# Patient Record
Sex: Female | Born: 1945 | Race: White | Hispanic: No | Smoking: Current every day smoker
Health system: Southern US, Community
[De-identification: ages and names within clinical notes are randomized; demographics above are authoritative.]

## PROBLEM LIST (undated history)

## (undated) DIAGNOSIS — Z720 Tobacco use: Secondary | ICD-10-CM

## (undated) HISTORY — DX: Tobacco use: Z72.0

---

## 1975-08-08 HISTORY — PX: TUBAL LIGATION: SHX77

## 1999-11-29 ENCOUNTER — Encounter: Admission: RE | Admit: 1999-11-29 | Discharge: 1999-11-29 | Payer: Self-pay | Admitting: Family Medicine

## 1999-11-29 ENCOUNTER — Encounter: Payer: Self-pay | Admitting: Family Medicine

## 2000-01-09 ENCOUNTER — Other Ambulatory Visit: Admission: RE | Admit: 2000-01-09 | Discharge: 2000-01-09 | Payer: Self-pay | Admitting: Obstetrics and Gynecology

## 2000-01-09 ENCOUNTER — Encounter (INDEPENDENT_AMBULATORY_CARE_PROVIDER_SITE_OTHER): Payer: Self-pay | Admitting: Specialist

## 2000-03-13 ENCOUNTER — Other Ambulatory Visit: Admission: RE | Admit: 2000-03-13 | Discharge: 2000-03-13 | Payer: Self-pay | Admitting: Obstetrics and Gynecology

## 2000-03-13 ENCOUNTER — Encounter (INDEPENDENT_AMBULATORY_CARE_PROVIDER_SITE_OTHER): Payer: Self-pay | Admitting: Specialist

## 2000-07-23 ENCOUNTER — Other Ambulatory Visit: Admission: RE | Admit: 2000-07-23 | Discharge: 2000-07-23 | Payer: Self-pay | Admitting: Obstetrics and Gynecology

## 2000-07-24 ENCOUNTER — Encounter (INDEPENDENT_AMBULATORY_CARE_PROVIDER_SITE_OTHER): Payer: Self-pay | Admitting: Specialist

## 2000-07-24 ENCOUNTER — Other Ambulatory Visit: Admission: RE | Admit: 2000-07-24 | Discharge: 2000-07-24 | Payer: Self-pay | Admitting: Obstetrics and Gynecology

## 2000-08-07 HISTORY — PX: PARTIAL HYSTERECTOMY: SHX80

## 2000-10-16 ENCOUNTER — Other Ambulatory Visit: Admission: RE | Admit: 2000-10-16 | Discharge: 2000-10-16 | Payer: Self-pay | Admitting: Obstetrics and Gynecology

## 2000-10-17 ENCOUNTER — Encounter (INDEPENDENT_AMBULATORY_CARE_PROVIDER_SITE_OTHER): Payer: Self-pay

## 2000-10-17 ENCOUNTER — Other Ambulatory Visit: Admission: RE | Admit: 2000-10-17 | Discharge: 2000-10-17 | Payer: Self-pay | Admitting: Obstetrics and Gynecology

## 2001-02-11 ENCOUNTER — Other Ambulatory Visit: Admission: RE | Admit: 2001-02-11 | Discharge: 2001-02-11 | Payer: Self-pay | Admitting: Obstetrics and Gynecology

## 2001-03-06 ENCOUNTER — Other Ambulatory Visit: Admission: RE | Admit: 2001-03-06 | Discharge: 2001-03-06 | Payer: Self-pay | Admitting: Obstetrics and Gynecology

## 2001-05-14 ENCOUNTER — Observation Stay (HOSPITAL_COMMUNITY): Admission: RE | Admit: 2001-05-14 | Discharge: 2001-05-15 | Payer: Self-pay | Admitting: Obstetrics and Gynecology

## 2001-05-14 ENCOUNTER — Encounter (INDEPENDENT_AMBULATORY_CARE_PROVIDER_SITE_OTHER): Payer: Self-pay | Admitting: Specialist

## 2002-04-08 ENCOUNTER — Encounter: Admission: RE | Admit: 2002-04-08 | Discharge: 2002-04-08 | Payer: Self-pay | Admitting: Obstetrics and Gynecology

## 2002-04-08 ENCOUNTER — Encounter: Payer: Self-pay | Admitting: Obstetrics and Gynecology

## 2002-07-10 ENCOUNTER — Other Ambulatory Visit: Admission: RE | Admit: 2002-07-10 | Discharge: 2002-07-10 | Payer: Self-pay | Admitting: Obstetrics and Gynecology

## 2008-08-27 ENCOUNTER — Encounter
Admission: RE | Admit: 2008-08-27 | Discharge: 2008-08-27 | Payer: Self-pay | Admitting: Physical Medicine & Rehabilitation

## 2010-08-07 HISTORY — PX: BREAST BIOPSY: SHX20

## 2010-12-23 NOTE — Op Note (Signed)
[facility]  Patient:    Melinda Torres, Melinda Torres Visit Number: [id number] MRN: [id number]          Service Type: DSU Location: [id number] Attending Physician:  Jenean Lindau Dictated by:   Laqueta Linden, M.D. Proc. Date: 05/14/01 Admit Date:  05/14/2001   CC:         Derwood Kaplan., [facility]   Operative Report  PREOPERATIVE DIAGNOSIS:       Recurrent high grade SIL.  POSTOPERATIVE DIAGNOSIS:      Recurrent high grade SIL.  OPERATION:                    Transvaginal hysterectomy.  SURGEON:                      Laqueta Linden, M.D.  ASSISTANT:                    Cheryle Horsfall, M.D.  ANESTHESIA:                   General endotracheal anesthesia.  ESTIMATED BLOOD LOSS:         Less than 50 cc.  INTRAVENOUS FLUIDS:           1 liter Crystalloid.  URINE OUTPUT:                 30 cc in and out catheterization at end of                               procedure.  COUNTS:                       Correct x 2.  COMPLICATIONS:                None.  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 Follow-up Pap smears initially were within normal limits with negative endocervical curettage. She had an abnormal Pap smear in July of 2002 which was suggestive of CIN I, however, follow-up colposcopy and biopsies revealed CIN II and III with a positive endocervical curettage with CIN I. The patient was counseled regarding the management for recurrent high grade dysplasia including the alternative of a repeat LEEP with follow-up colposcopies versus vaginal hysterectomy. She elected to proceed to definitive surgical management due  to the recurrence of this high grade process. She has seen the informed consent film, she has been extensively counseled as to the risks, benefits  and alternatives and complications of the procedure including but not limited to anesthesia risks which are increased due to her smoking, infection, bleeding possibly requiring a transfusion, injury to bowel, bladder, ureters, vessels or nerves, fistula formation including vesico or recto, or enterovaginal fistulas and their intendent consequences, as well as other risks including DVT, PE, pneumonia, death, and other unnamed risks. She has been extensively counseled and signed the appropriate consent film, had all of her questions answered and agrees to proceed. She desires ovarian removal if it is technically possible at the time of surgery.  DESCRIPTION OF PROCEDURE:     The patient was taken to the  operating room and after proper identification and consents were ascertained, she was placed on the operating room table in the supine position. After the induction of general endotracheal anesthesia, she was placed in the candy cane stirrups and the perineum and vagina were prepped and draped in routine sterile fashion. The bladder was emptied with a red rubber catheter. The weighted speculum was placed in the posterior vagina and the cervix grasped with a single tooth tenaculum and was noted to advance well into the operative field. The portio was then injected with a 1:100,000 solution of epinephrine. The cervix was then circumscribed with a scalpel. The mucosa was then advanced bluntly off of the cervix using a finger and a Ray-Tec sponge. The posterior vaginal mucosa was then tented down and the posterior cul-de-sac entered sharply using curved Mayo scissors. The uterosacral ligaments were clamped with curved Heaney clamps bilaterally, cut and Heaney ligated with #0 Vicryl and tagged. The anterior peritoneal reflection was then identified and entered sharply without obvious injury or entry into the bladder. BMX clamp was placed across the cardinal ligament and uterine vessel complex due  to the very small size of the uterus. This pedicle was cut and suture ligated with #0 Vicryl suture. The fundus was located posteriorly and a curved clamp placed across the proximal adnexal pedicles bilaterally with removal of the specimen. This was sent immediately to pathology as a fresh specimen to have the cervix cut as a cone. At this point both ovaries were visualized and noted to be quite far up the pelvic sidewall, neither of which were mobile enough to remove transvaginally. For this reason the ovaries and tubes were left in place after prior discussion with the patient. The adnexal pedicles were triply ligated with two free ties and a stitch of #0 Vicryl. The parietoperitoneum was then closed in a pursestring fashion using 2-0 Vicryl suture with release of the adnexal pedicles in the peritoneal cavity. Hemostasis was noted to be excellent. Counts were correct prior to closure of the peritoneum. The uterosacral tags were then tied in the midline. The vaginal cuff was closed from side-to-side using interrupted figure-of-eight sutures of #0 Vicryl. Hemostasis was noted to be excellent. The bladder was emptied of 30 cc of clear urine which had accumulated during the approximate 30 minute procedure. All instruments were removed. A PeriPad was applied and the patient was awakened and transferred to the recovery room in stable condition. She will be admitted for overnight observation as an outpatient. Estimated blood loss was less than 50 cc. Urine output was 30 cc. Counts correct x 2. There were no complications. Dictated by:   Laqueta Linden, M.D. Attending Physician:  Jenean Lindau DD:  05/14/01 TD:  05/14/01 Job: [id number]

## 2011-04-20 ENCOUNTER — Institutional Professional Consult (permissible substitution): Payer: Self-pay | Admitting: Internal Medicine

## 2011-05-03 ENCOUNTER — Other Ambulatory Visit: Payer: Self-pay | Admitting: Family Medicine

## 2011-05-03 DIAGNOSIS — Z1231 Encounter for screening mammogram for malignant neoplasm of breast: Secondary | ICD-10-CM

## 2011-05-12 ENCOUNTER — Encounter: Payer: Self-pay | Admitting: Internal Medicine

## 2011-05-12 ENCOUNTER — Ambulatory Visit (INDEPENDENT_AMBULATORY_CARE_PROVIDER_SITE_OTHER)
Admission: RE | Admit: 2011-05-12 | Discharge: 2011-05-12 | Disposition: A | Discharge status: Home / Self Care | Payer: Medicare Other | Source: Ambulatory Visit | Attending: Internal Medicine | Admitting: Internal Medicine

## 2011-05-12 ENCOUNTER — Ambulatory Visit: Payer: Self-pay

## 2011-05-12 ENCOUNTER — Ambulatory Visit (INDEPENDENT_AMBULATORY_CARE_PROVIDER_SITE_OTHER): Payer: Medicare Other | Admitting: Internal Medicine

## 2011-05-12 VITALS — BP 130/72 | HR 61 | Temp 98.9°F | Ht 64.0 in | Wt 160.4 lb

## 2011-05-12 DIAGNOSIS — F17200 Nicotine dependence, unspecified, uncomplicated: Secondary | ICD-10-CM

## 2011-05-12 DIAGNOSIS — J449 Chronic obstructive pulmonary disease, unspecified: Secondary | ICD-10-CM | POA: Insufficient documentation

## 2011-05-12 NOTE — Progress Notes (Signed)
  Subjective:    Patient ID: Melinda Torres, female    DOB: November 08, 1945, 65 y.o.   MRN: [id number]  HPI  36 yowf longterm smoker with no signifcant problems until around 2011 with [place]alking [place] a [place]  and referred by Dr Ludwig Clarks for eval 05/12/2011   05/12/2011 Initial pulmonary office eval in EMR era. Cc doe x mailbox only but relatively sedentary. Also with flight of steps but no need to stop.also, no assoc cp. cc cough esp in am's worse in cold weather x sev years> sev tbps mucoid sputum daily.  Sleeping ok without nocturnal  or early am exacerbation  of respiratory  c/o's or need for noct saba. Also denies any obvious fluctuation of symptoms with weather or environmental changes or other aggravating or alleviating factors except as outlined above.  Does not recall ever using saba.      Review of Systems  Constitutional: Negative for fever, chills and unexpected weight change.  HENT: Negative for ear pain, nosebleeds, congestion, sore throat, rhinorrhea, sneezing, trouble swallowing, dental problem, voice change, postnasal drip and sinus pressure.   Eyes: Negative for visual disturbance.  Respiratory: Positive for cough and shortness of breath. Negative for choking.   Cardiovascular: Negative for chest pain and leg swelling.  Gastrointestinal: Negative for vomiting, abdominal pain and diarrhea.  Genitourinary: Negative for difficulty urinating.  Musculoskeletal: Negative for arthralgias.  Skin: Negative for rash.  Neurological: Negative for tremors, syncope and headaches.  Hematological: Does not bruise/bleed easily.       Objective:   Physical Exam   amb wf nad  Wt 160 05/12/2011   Full set of dentures  HEENT mild turbinate edema.  Oropharynx no thrush or excess pnd or cobblestoning.  No JVD or cervical adenopathy. Mild accessory muscle hypertrophy. Trachea midline, nl thryroid. Chest was hyperinflated by  percussion with diminished breath sounds and moderate increased exp time without wheeze. Hoover sign positive at mid inspiration. Regular rate and rhythm without murmur gallop or rub or increase P2 or edema.  Abd: no hsm, nl excursion. Ext warm without cyanosis or clubbing.      CXR  05/12/2011 :  Mild COPD changes with no worrisome focal or acute cardiopulmonary abnormality noted.  Upper lumbar vertebral compression fracture identified of questionable age. This could be better assessed with lumbosacral spine series if desired.    Assessment & Plan:

## 2011-05-12 NOTE — Patient Instructions (Signed)
We will call you with your cxr results  Please schedule a follow up office visit in 6 weeks, call sooner if needed with pft's - no need for inhalers in meantime

## 2011-05-13 ENCOUNTER — Encounter: Payer: Self-pay | Admitting: Internal Medicine

## 2011-05-13 DIAGNOSIS — F17200 Nicotine dependence, unspecified, uncomplicated: Secondary | ICD-10-CM | POA: Insufficient documentation

## 2011-05-13 NOTE — Assessment & Plan Note (Signed)
Clinically moderate assoc with CB but minimal ab  DDX of  difficult airways managment all start with A and  include Adherence, Ace Inhibitors, Acid Reflux, Active Sinus Disease, Alpha 1 Antitripsin deficiency, Anxiety masquerading as Airways dz,  ABPA,  allergy(esp in young), Aspiration (esp in elderly), Adverse effects of DPI,  Active smokers, plus two Bs  = Bronchiectasis and Beta blocker use..and one C= CHF  Active smoking greatest concern, discussed separtately  Needs pft's to quantify

## 2011-05-13 NOTE — Assessment & Plan Note (Signed)
I reviewed the Flethcher curve with patient that basically indicates  if you quit smoking when your best day FEV1 is still well preserved it is highly unlikely you will progress to severe disease and informed the patient there was no medication on the market that has proven to change the curve or the likelihood of progression.  Therefore stopping smoking and maintaining abstinence is the most important aspect of care, not choice of inhalers or for that matter, doctors.    I emphasized that although we never turn away smokers from the pulmonary clinic, we do ask that they understand that the recommendations that we make  won't work nearly as well in the presence of continued cigarette exposure.  In fact, we may very well  reach a point where we can't promise to help the patient if he/she can't quit smoking. (We can and will promise to try to help, we just can't promise what we recommend will really work)

## 2011-05-15 NOTE — Progress Notes (Signed)
Quick Note:  Spoke with pt and notified of results per Dr. Wert. Pt verbalized understanding and denied any questions.  ______ 

## 2011-05-16 ENCOUNTER — Ambulatory Visit
Admission: RE | Admit: 2011-05-16 | Discharge: 2011-05-16 | Disposition: A | Discharge status: Home / Self Care | Payer: Medicare Other | Source: Ambulatory Visit | Attending: Family Medicine | Admitting: Family Medicine

## 2011-05-16 DIAGNOSIS — Z1231 Encounter for screening mammogram for malignant neoplasm of breast: Secondary | ICD-10-CM

## 2011-05-25 ENCOUNTER — Other Ambulatory Visit: Payer: Self-pay | Admitting: Family Medicine

## 2011-05-25 DIAGNOSIS — R928 Other abnormal and inconclusive findings on diagnostic imaging of breast: Secondary | ICD-10-CM

## 2011-06-07 ENCOUNTER — Ambulatory Visit
Admission: RE | Admit: 2011-06-07 | Discharge: 2011-06-07 | Disposition: A | Discharge status: Home / Self Care | Payer: Medicare Other | Source: Ambulatory Visit | Attending: Family Medicine | Admitting: Family Medicine

## 2011-06-07 ENCOUNTER — Other Ambulatory Visit: Payer: Self-pay | Admitting: Family Medicine

## 2011-06-07 DIAGNOSIS — R928 Other abnormal and inconclusive findings on diagnostic imaging of breast: Secondary | ICD-10-CM

## 2011-06-23 ENCOUNTER — Ambulatory Visit: Payer: Medicare Other | Admitting: Internal Medicine

## 2011-07-25 ENCOUNTER — Ambulatory Visit: Payer: Medicare Other | Admitting: Internal Medicine

## 2012-05-01 ENCOUNTER — Other Ambulatory Visit: Payer: Self-pay | Admitting: Family Medicine

## 2012-05-01 DIAGNOSIS — R921 Mammographic calcification found on diagnostic imaging of breast: Secondary | ICD-10-CM

## 2012-05-17 ENCOUNTER — Ambulatory Visit
Admission: RE | Admit: 2012-05-17 | Discharge: 2012-05-17 | Disposition: A | Discharge status: Home / Self Care | Payer: Medicare Other | Source: Ambulatory Visit | Attending: Family Medicine | Admitting: Family Medicine

## 2012-05-17 DIAGNOSIS — R921 Mammographic calcification found on diagnostic imaging of breast: Secondary | ICD-10-CM

## 2013-05-01 ENCOUNTER — Other Ambulatory Visit: Payer: Self-pay

## 2013-05-01 DIAGNOSIS — Z1231 Encounter for screening mammogram for malignant neoplasm of breast: Secondary | ICD-10-CM

## 2013-05-23 ENCOUNTER — Ambulatory Visit
Admission: RE | Admit: 2013-05-23 | Discharge: 2013-05-23 | Disposition: A | Discharge status: Home / Self Care | Payer: Medicare Other | Source: Ambulatory Visit

## 2013-05-23 DIAGNOSIS — Z1231 Encounter for screening mammogram for malignant neoplasm of breast: Secondary | ICD-10-CM

## 2014-05-12 ENCOUNTER — Other Ambulatory Visit: Payer: Self-pay

## 2014-05-12 DIAGNOSIS — Z1231 Encounter for screening mammogram for malignant neoplasm of breast: Secondary | ICD-10-CM

## 2014-05-29 ENCOUNTER — Ambulatory Visit
Admission: RE | Admit: 2014-05-29 | Discharge: 2014-05-29 | Disposition: A | Discharge status: Home / Self Care | Payer: Medicare Other | Source: Ambulatory Visit

## 2014-05-29 DIAGNOSIS — Z1231 Encounter for screening mammogram for malignant neoplasm of breast: Secondary | ICD-10-CM

## 2015-07-26 ENCOUNTER — Other Ambulatory Visit: Payer: Self-pay

## 2015-07-26 DIAGNOSIS — Z1231 Encounter for screening mammogram for malignant neoplasm of breast: Secondary | ICD-10-CM

## 2015-08-11 ENCOUNTER — Ambulatory Visit
Admission: RE | Admit: 2015-08-11 | Discharge: 2015-08-11 | Disposition: A | Discharge status: Home / Self Care | Payer: Medicare Other | Source: Ambulatory Visit

## 2015-08-11 DIAGNOSIS — Z1231 Encounter for screening mammogram for malignant neoplasm of breast: Secondary | ICD-10-CM

## 2017-07-04 ENCOUNTER — Other Ambulatory Visit: Payer: Self-pay | Admitting: Physician Assistant

## 2017-07-04 DIAGNOSIS — Z139 Encounter for screening, unspecified: Secondary | ICD-10-CM

## 2017-08-01 ENCOUNTER — Ambulatory Visit: Payer: Medicare Other

## 2017-08-24 ENCOUNTER — Ambulatory Visit: Payer: Medicare Other

## 2018-07-19 ENCOUNTER — Other Ambulatory Visit: Payer: Self-pay | Admitting: Family Medicine

## 2018-07-19 DIAGNOSIS — Z1231 Encounter for screening mammogram for malignant neoplasm of breast: Secondary | ICD-10-CM

## 2018-08-01 ENCOUNTER — Other Ambulatory Visit: Payer: Self-pay | Admitting: Family Medicine

## 2018-08-01 ENCOUNTER — Ambulatory Visit
Admission: RE | Admit: 2018-08-01 | Discharge: 2018-08-01 | Disposition: A | Discharge status: Home / Self Care | Payer: Medicare Other | Source: Ambulatory Visit | Attending: Family Medicine | Admitting: Family Medicine

## 2018-08-01 DIAGNOSIS — M810 Age-related osteoporosis without current pathological fracture: Secondary | ICD-10-CM

## 2018-08-01 DIAGNOSIS — Z1231 Encounter for screening mammogram for malignant neoplasm of breast: Secondary | ICD-10-CM

## 2018-08-01 DIAGNOSIS — Z720 Tobacco use: Secondary | ICD-10-CM

## 2018-09-23 ENCOUNTER — Other Ambulatory Visit: Payer: Self-pay | Admitting: Family Medicine

## 2018-09-23 DIAGNOSIS — E2839 Other primary ovarian failure: Secondary | ICD-10-CM

## 2018-09-24 ENCOUNTER — Ambulatory Visit
Admission: RE | Admit: 2018-09-24 | Discharge: 2018-09-24 | Disposition: A | Discharge status: Home / Self Care | Payer: Medicare Other | Source: Ambulatory Visit | Attending: Family Medicine | Admitting: Family Medicine

## 2018-09-24 ENCOUNTER — Other Ambulatory Visit: Payer: Medicare Other

## 2018-09-24 DIAGNOSIS — Z720 Tobacco use: Secondary | ICD-10-CM

## 2018-10-01 ENCOUNTER — Other Ambulatory Visit: Payer: Self-pay | Admitting: Family Medicine

## 2018-10-01 DIAGNOSIS — K7460 Unspecified cirrhosis of liver: Secondary | ICD-10-CM

## 2018-10-07 ENCOUNTER — Ambulatory Visit
Admission: RE | Admit: 2018-10-07 | Discharge: 2018-10-07 | Disposition: A | Discharge status: Home / Self Care | Payer: Medicare Other | Source: Ambulatory Visit | Attending: Family Medicine | Admitting: Family Medicine

## 2018-10-07 DIAGNOSIS — K7460 Unspecified cirrhosis of liver: Secondary | ICD-10-CM

## 2018-11-26 ENCOUNTER — Other Ambulatory Visit: Payer: Medicare Other

## 2018-12-27 ENCOUNTER — Other Ambulatory Visit: Payer: Medicare Other

## 2019-02-18 ENCOUNTER — Ambulatory Visit
Admission: RE | Admit: 2019-02-18 | Discharge: 2019-02-18 | Disposition: A | Discharge status: Home / Self Care | Payer: Medicare Other | Source: Ambulatory Visit | Attending: Family Medicine | Admitting: Family Medicine

## 2019-02-18 ENCOUNTER — Other Ambulatory Visit: Payer: Self-pay

## 2019-02-18 DIAGNOSIS — E2839 Other primary ovarian failure: Secondary | ICD-10-CM

## 2019-06-04 ENCOUNTER — Other Ambulatory Visit: Payer: Self-pay | Admitting: Family Medicine

## 2019-06-04 DIAGNOSIS — Z1231 Encounter for screening mammogram for malignant neoplasm of breast: Secondary | ICD-10-CM

## 2019-08-04 ENCOUNTER — Other Ambulatory Visit: Payer: Self-pay

## 2019-08-04 ENCOUNTER — Ambulatory Visit
Admission: RE | Admit: 2019-08-04 | Discharge: 2019-08-04 | Disposition: A | Discharge status: Home / Self Care | Payer: Medicare Other | Source: Ambulatory Visit | Attending: Family Medicine | Admitting: Family Medicine

## 2019-08-04 DIAGNOSIS — Z1231 Encounter for screening mammogram for malignant neoplasm of breast: Secondary | ICD-10-CM

## 2019-08-19 ENCOUNTER — Other Ambulatory Visit: Payer: Self-pay | Admitting: Family Medicine

## 2019-08-19 ENCOUNTER — Ambulatory Visit
Admission: RE | Admit: 2019-08-19 | Discharge: 2019-08-19 | Disposition: A | Discharge status: Home / Self Care | Payer: Medicare Other | Source: Ambulatory Visit | Attending: Family Medicine | Admitting: Family Medicine

## 2019-08-19 ENCOUNTER — Other Ambulatory Visit: Payer: Self-pay

## 2019-08-19 DIAGNOSIS — M810 Age-related osteoporosis without current pathological fracture: Secondary | ICD-10-CM

## 2019-08-19 DIAGNOSIS — W19XXXA Unspecified fall, initial encounter: Secondary | ICD-10-CM

## 2020-05-12 ENCOUNTER — Other Ambulatory Visit: Payer: Self-pay | Admitting: Family Medicine

## 2020-05-12 DIAGNOSIS — Z1231 Encounter for screening mammogram for malignant neoplasm of breast: Secondary | ICD-10-CM

## 2020-08-04 ENCOUNTER — Ambulatory Visit
Admission: RE | Admit: 2020-08-04 | Discharge: 2020-08-04 | Disposition: A | Discharge status: Home / Self Care | Payer: Medicare Other | Source: Ambulatory Visit | Attending: Family Medicine | Admitting: Family Medicine

## 2020-08-04 ENCOUNTER — Other Ambulatory Visit: Payer: Self-pay

## 2020-08-04 DIAGNOSIS — Z1231 Encounter for screening mammogram for malignant neoplasm of breast: Secondary | ICD-10-CM

## 2020-10-28 ENCOUNTER — Other Ambulatory Visit (HOSPITAL_BASED_OUTPATIENT_CLINIC_OR_DEPARTMENT_OTHER): Payer: Self-pay | Admitting: Family Medicine

## 2020-10-28 DIAGNOSIS — F17200 Nicotine dependence, unspecified, uncomplicated: Secondary | ICD-10-CM

## 2020-11-15 ENCOUNTER — Other Ambulatory Visit: Payer: Self-pay

## 2020-11-15 ENCOUNTER — Ambulatory Visit (HOSPITAL_BASED_OUTPATIENT_CLINIC_OR_DEPARTMENT_OTHER)
Admission: RE | Admit: 2020-11-15 | Discharge: 2020-11-15 | Disposition: A | Discharge status: Home / Self Care | Payer: Medicare Other | Source: Ambulatory Visit | Attending: Family Medicine | Admitting: Family Medicine

## 2020-11-15 DIAGNOSIS — F17210 Nicotine dependence, cigarettes, uncomplicated: Secondary | ICD-10-CM | POA: Insufficient documentation

## 2020-11-15 DIAGNOSIS — Z122 Encounter for screening for malignant neoplasm of respiratory organs: Secondary | ICD-10-CM | POA: Diagnosis not present

## 2020-11-15 DIAGNOSIS — F17200 Nicotine dependence, unspecified, uncomplicated: Secondary | ICD-10-CM

## 2020-11-15 DIAGNOSIS — J432 Centrilobular emphysema: Secondary | ICD-10-CM | POA: Diagnosis not present

## 2020-11-15 DIAGNOSIS — I700 Atherosclerosis of aorta: Secondary | ICD-10-CM | POA: Insufficient documentation

## 2020-11-15 DIAGNOSIS — I2510 Atherosclerotic heart disease of native coronary artery without angina pectoris: Secondary | ICD-10-CM | POA: Diagnosis not present

## 2021-08-26 ENCOUNTER — Other Ambulatory Visit: Payer: Self-pay | Admitting: Family Medicine

## 2021-08-26 DIAGNOSIS — Z1231 Encounter for screening mammogram for malignant neoplasm of breast: Secondary | ICD-10-CM

## 2021-09-21 ENCOUNTER — Ambulatory Visit
Admission: RE | Admit: 2021-09-21 | Discharge: 2021-09-21 | Disposition: A | Discharge status: Home / Self Care | Payer: Medicare Other | Source: Ambulatory Visit | Attending: Family Medicine | Admitting: Family Medicine

## 2021-09-21 DIAGNOSIS — Z1231 Encounter for screening mammogram for malignant neoplasm of breast: Secondary | ICD-10-CM

## 2021-11-02 ENCOUNTER — Other Ambulatory Visit (HOSPITAL_BASED_OUTPATIENT_CLINIC_OR_DEPARTMENT_OTHER): Payer: Self-pay | Admitting: Family Medicine

## 2021-11-02 DIAGNOSIS — M8000XD Age-related osteoporosis with current pathological fracture, unspecified site, subsequent encounter for fracture with routine healing: Secondary | ICD-10-CM

## 2021-11-02 DIAGNOSIS — Z87891 Personal history of nicotine dependence: Secondary | ICD-10-CM

## 2021-11-15 ENCOUNTER — Ambulatory Visit (HOSPITAL_BASED_OUTPATIENT_CLINIC_OR_DEPARTMENT_OTHER)
Admission: RE | Admit: 2021-11-15 | Discharge: 2021-11-15 | Disposition: A | Discharge status: Home / Self Care | Payer: Medicare Other | Source: Ambulatory Visit | Attending: Family Medicine | Admitting: Family Medicine

## 2021-11-15 DIAGNOSIS — M8000XD Age-related osteoporosis with current pathological fracture, unspecified site, subsequent encounter for fracture with routine healing: Secondary | ICD-10-CM

## 2021-11-15 DIAGNOSIS — Z8262 Family history of osteoporosis: Secondary | ICD-10-CM | POA: Diagnosis not present

## 2021-11-15 DIAGNOSIS — Z87891 Personal history of nicotine dependence: Secondary | ICD-10-CM | POA: Insufficient documentation

## 2021-11-15 DIAGNOSIS — J439 Emphysema, unspecified: Secondary | ICD-10-CM | POA: Diagnosis not present

## 2021-11-15 DIAGNOSIS — Z13820 Encounter for screening for osteoporosis: Secondary | ICD-10-CM | POA: Insufficient documentation

## 2021-11-15 DIAGNOSIS — Z780 Asymptomatic menopausal state: Secondary | ICD-10-CM | POA: Insufficient documentation

## 2021-11-15 DIAGNOSIS — Z122 Encounter for screening for malignant neoplasm of respiratory organs: Secondary | ICD-10-CM | POA: Insufficient documentation

## 2021-11-15 DIAGNOSIS — I700 Atherosclerosis of aorta: Secondary | ICD-10-CM | POA: Diagnosis not present

## 2021-11-18 ENCOUNTER — Encounter: Payer: Self-pay | Admitting: *Deleted

## 2021-11-18 DIAGNOSIS — R911 Solitary pulmonary nodule: Secondary | ICD-10-CM

## 2021-11-18 NOTE — Progress Notes (Signed)
I received referral on Melinda Torres today.  Looking at the notes, patient needs to be seen with pulmonary.  Dr. Arbutus Ped aware and referral to pulmonary completed.   ?

## 2021-12-14 ENCOUNTER — Encounter: Payer: Self-pay | Admitting: Pulmonary Disease

## 2021-12-14 ENCOUNTER — Ambulatory Visit: Payer: Medicare Other | Admitting: Pulmonary Disease

## 2021-12-14 VITALS — BP 118/62 | HR 51 | Temp 98.6°F | Ht 64.0 in | Wt 157.6 lb

## 2021-12-14 DIAGNOSIS — Z720 Tobacco use: Secondary | ICD-10-CM

## 2021-12-14 DIAGNOSIS — J432 Centrilobular emphysema: Secondary | ICD-10-CM

## 2021-12-14 DIAGNOSIS — R911 Solitary pulmonary nodule: Secondary | ICD-10-CM | POA: Diagnosis not present

## 2021-12-14 DIAGNOSIS — F17200 Nicotine dependence, unspecified, uncomplicated: Secondary | ICD-10-CM | POA: Diagnosis not present

## 2021-12-14 DIAGNOSIS — Z716 Tobacco abuse counseling: Secondary | ICD-10-CM

## 2021-12-14 MED ORDER — AMOXICILLIN-POT CLAVULANATE 875-125 MG PO TABS: 1.0000 | ORAL_TABLET | Freq: Two times a day (BID) | ORAL | 0 refills | Status: AC

## 2021-12-14 NOTE — Progress Notes (Signed)
? ?Synopsis: Referred in May 2023 for the lung nodule, abnormal lung cancer screening CT by Si Gaul, MD ? ?Subjective:  ? ?PATIENT ID: Melinda Torres Height GENDER: female DOB: 06/03/1946, MRN: [id number] ? ?Chief Complaint  ?Patient presents with  ? Consult  ?  Patient here to talk about nodule in right lung.   ? ? ?This is a 76 year old female, past medical history of tobacco use.  She has smoked since she was a teenager 40+ years.  She had a lung cancer screening CT completed by primary care which showed a new right upper lobe pulmonary nodule this was located in the periphery of the lung there was some likely evidence of inflammation around this.  Felt to be more inflammatory than malignant.  Recommended short-term follow-up.  She also woke up this morning with earache and pain in her right ear.  She did not receive any antibiotics during this timeframe.  She did felt bad and felt sick during the time that she got her lung cancer screening CT. ? ? ?Past Medical History:  ?Diagnosis Date  ? Tobacco use   ?  ? ?Family History  ?Problem Relation Age of Onset  ? Allergies Daughter   ?  ? ?Past Surgical History:  ?Procedure Laterality Date  ? PARTIAL HYSTERECTOMY  2002  ? TUBAL LIGATION  1977  ? ? ?Social History  ? ?Socioeconomic History  ? Marital status: Divorced  ?  Spouse name: Not on file  ? Number of children: 3  ? Years of education: Not on file  ? Highest education level: Not on file  ?Occupational History  ? Occupation: Retired  ?  Comment: Data Processor  ?Tobacco Use  ? Smoking status: Every Day  ?  Packs/day: 2.00  ?  Years: 40.00  ?  Pack years: 80.00  ?  Types: Cigarettes  ? Smokeless tobacco: Never  ?Substance and Sexual Activity  ? Alcohol use: No  ? Drug use: No  ? Sexual activity: Not on file  ?Other Topics Concern  ? Not on file  ?Social History Narrative  ? Not on file  ? ?Social Determinants of Health  ? ?Financial Resource Strain: Not on file  ?Food Insecurity: Not on file   ?Transportation Needs: Not on file  ?Physical Activity: Not on file  ?Stress: Not on file  ?Social Connections: Not on file  ?Intimate Partner Violence: Not on file  ?  ? ?No Known Allergies  ? ?Outpatient Medications Prior to Visit  ?Medication Sig Dispense Refill  ? SPIRIVA RESPIMAT 2.5 MCG/ACT AERS SMARTSIG:2 Puff(s) Via Inhaler Daily PRN    ? fluconazole (DIFLUCAN) 150 MG tablet Take 150 mg by mouth daily.      ? ?No facility-administered medications prior to visit.  ? ? ?Review of Systems  ?Constitutional:  Negative for chills, fever, malaise/fatigue and weight loss.  ?HENT:  Negative for hearing loss, sore throat and tinnitus.   ?Eyes:  Negative for blurred vision and double vision.  ?Respiratory:  Positive for cough and shortness of breath. Negative for hemoptysis, sputum production, wheezing and stridor.   ?Cardiovascular:  Negative for chest pain, palpitations, orthopnea, leg swelling and PND.  ?Gastrointestinal:  Negative for abdominal pain, constipation, diarrhea, heartburn, nausea and vomiting.  ?Genitourinary:  Negative for dysuria, hematuria and urgency.  ?Musculoskeletal:  Negative for joint pain and myalgias.  ?Skin:  Negative for itching and rash.  ?Neurological:  Negative for dizziness, tingling, weakness and headaches.  ?Endo/Heme/Allergies:  Negative for environmental allergies. Does  not bruise/bleed easily.  ?Psychiatric/Behavioral:  Negative for depression. The patient is not nervous/anxious and does not have insomnia.   ?All other systems reviewed and are negative. ? ? ?Objective:  ?Physical Exam ?Vitals reviewed.  ?Constitutional:   ?   General: She is not in acute distress. ?   Appearance: She is well-developed.  ?HENT:  ?   Head: Normocephalic and atraumatic.  ?Eyes:  ?   General: No scleral icterus. ?   Conjunctiva/sclera: Conjunctivae normal.  ?   Pupils: Pupils are equal, round, and reactive to light.  ?Neck:  ?   Vascular: No JVD.  ?   Trachea: No tracheal deviation.  ?Cardiovascular:   ?   Rate and Rhythm: Normal rate and regular rhythm.  ?   Heart sounds: Normal heart sounds. No murmur heard. ?Pulmonary:  ?   Effort: Pulmonary effort is normal. No tachypnea, accessory muscle usage or respiratory distress.  ?   Breath sounds: No stridor. No wheezing, rhonchi or rales.  ?   Comments: Diminished breath sounds bilaterally ?Abdominal:  ?   General: There is no distension.  ?   Palpations: Abdomen is soft.  ?   Tenderness: There is no abdominal tenderness.  ?Musculoskeletal:     ?   General: No tenderness.  ?   Cervical back: Neck supple.  ?Lymphadenopathy:  ?   Cervical: No cervical adenopathy.  ?Skin: ?   General: Skin is warm and dry.  ?   Capillary Refill: Capillary refill takes less than 2 seconds.  ?   Findings: No rash.  ?Neurological:  ?   Mental Status: She is alert and oriented to person, place, and time.  ?Psychiatric:     ?   Behavior: Behavior normal.  ? ? ? ?Vitals:  ? 12/14/21 1143  ?BP: 118/62  ?Pulse: (!) 51  ?Temp: 98.6 ?F (37 ?C)  ?TempSrc: Oral  ?SpO2: 99%  ?Weight: 157 lb 9.6 oz (71.5 kg)  ?Height: 5\' 4"  (1.626 m)  ? ?99% on RA ?BMI Readings from Last 3 Encounters:  ?12/14/21 27.05 kg/m?  ?11/15/21 26.61 kg/m?  ?11/15/20 28.34 kg/m?  ? ?Wt Readings from Last 3 Encounters:  ?12/14/21 157 lb 9.6 oz (71.5 kg)  ?11/15/21 155 lb (70.3 kg)  ?11/15/20 160 lb (72.6 kg)  ? ? ? ?CBC ?No results found for: WBC, RBC, HGB, HCT, PLT, MCV, MCH, MCHC, RDW, LYMPHSABS, MONOABS, EOSABS, BASOSABS ? ?Chest Imaging: ?11/15/2021 lung cancer screening CT: ?12 mm right upper lobe pulmonary nodule in the periphery of the lung with associated inflammatory change. ?The patient's images have been independently reviewed by me.   ? ?Pulmonary Functions Testing Results: ?   ? View : No data to display.  ?  ?  ?  ? ? ?FeNO:  ? ?Pathology:  ? ?Echocardiogram:  ? ?Heart Catheterization:  ?   ?Assessment & Plan:  ? ?  ICD-10-CM   ?1. Lung nodule  R91.1 CT Super D Chest Wo Contrast  ?  ?2. Smoker  F17.200   ?  ?3.  Tobacco use  Z72.0   ?  ?4. Centrilobular emphysema (HCC)  J43.2   ?  ?5. Encounter for smoking cessation counseling  Z71.6   ?  ? ? ?Discussion: ? ?This is a 76 year old female, longstanding history of tobacco use 40+ years, and CT lung cancer screening CT with radiographic evidence of centrilobular emphysema. ? ?Plan: ?Today in the office we discussed smoking cessation counseling.Marland Kitchen. ?We also talked about her lung nodule. ?I  think that this looks more inflammatory on CT however it does need close follow-up. ?She is not treated with antibiotics at the time of CT. ?We will start her on Augmentin, complete 7-day course. ?Have a repeat noncontrasted CT scan of the chest in 6 weeks. ?Have an appointment to be scheduled with Jairo Ben, NP in mid June to discuss CT scan results. ? ?If the nodule is still persistent at 12 mm I would consider getting pulmonary function test and PET scan completed after next visit and then consideration for tissue biopsy pending on the PET scan results. ? ? ? ? ?Current Outpatient Medications:  ?  amoxicillin-clavulanate (AUGMENTIN) 875-125 MG tablet, Take 1 tablet by mouth 2 (two) times daily for 7 days., Disp: 14 tablet, Rfl: 0 ?  SPIRIVA RESPIMAT 2.5 MCG/ACT AERS, SMARTSIG:2 Puff(s) Via Inhaler Daily PRN, Disp: , Rfl:  ? ? ? ?Josephine Igo, DO ?[facility] ?12/14/2021 12:09 PM   ? ?

## 2021-12-14 NOTE — Patient Instructions (Addendum)
Thank you for visiting Dr. Valeta Harms at [facility]. ?Today we recommend the following: ? ?Orders Placed This Encounter  ?Procedures  ? CT Super D Chest Wo Contrast  ? ?Meds ordered this encounter  ?Medications  ? amoxicillin-clavulanate (AUGMENTIN) 875-125 MG tablet  ?  Sig: Take 1 tablet by mouth 2 (two) times daily for 7 days.  ?  Dispense:  14 tablet  ?  Refill:  0  ? ?See SG after the CT is complete.  ? ?Return in about 6 weeks (around 01/25/2022) for with Eric Form, NP. ? ? ? ?Please do your part to reduce the spread of COVID-19.  ? ?

## 2022-01-20 ENCOUNTER — Ambulatory Visit (HOSPITAL_BASED_OUTPATIENT_CLINIC_OR_DEPARTMENT_OTHER)
Admission: RE | Admit: 2022-01-20 | Discharge: 2022-01-20 | Disposition: A | Discharge status: Home / Self Care | Payer: Medicare Other | Source: Ambulatory Visit | Attending: Family Medicine | Admitting: Family Medicine

## 2022-01-20 DIAGNOSIS — R911 Solitary pulmonary nodule: Secondary | ICD-10-CM | POA: Diagnosis present

## 2022-02-01 NOTE — Progress Notes (Signed)
Ty, please let patient know I have reviewed her CT results and the nodules were were following has resolved. Please place referral to LDCT screening program for June 2024.    Thanks,  BLI  Josephine Igo, DO [facility] 02/01/2022 11:27 AM

## 2022-02-13 ENCOUNTER — Telehealth: Payer: Self-pay

## 2022-02-13 NOTE — Telephone Encounter (Signed)
Pt needs to be scheduled for an OV with Kandice Robinsons per [facility] to review CT result. Thank you

## 2022-08-23 ENCOUNTER — Other Ambulatory Visit: Payer: Self-pay | Admitting: Family Medicine

## 2022-08-23 DIAGNOSIS — Z1231 Encounter for screening mammogram for malignant neoplasm of breast: Secondary | ICD-10-CM

## 2022-10-12 ENCOUNTER — Ambulatory Visit
Admission: RE | Admit: 2022-10-12 | Discharge: 2022-10-12 | Disposition: A | Discharge status: Home / Self Care | Payer: Medicare Other | Source: Ambulatory Visit | Attending: Family Medicine | Admitting: Family Medicine

## 2022-10-12 DIAGNOSIS — Z1231 Encounter for screening mammogram for malignant neoplasm of breast: Secondary | ICD-10-CM

## 2022-12-05 ENCOUNTER — Other Ambulatory Visit (HOSPITAL_BASED_OUTPATIENT_CLINIC_OR_DEPARTMENT_OTHER): Payer: Self-pay | Admitting: Family Medicine

## 2022-12-05 DIAGNOSIS — K7469 Other cirrhosis of liver: Secondary | ICD-10-CM

## 2022-12-11 ENCOUNTER — Ambulatory Visit (HOSPITAL_BASED_OUTPATIENT_CLINIC_OR_DEPARTMENT_OTHER)
Admission: RE | Admit: 2022-12-11 | Discharge: 2022-12-11 | Disposition: A | Discharge status: Home / Self Care | Payer: Medicare Other | Source: Ambulatory Visit | Attending: Family Medicine | Admitting: Family Medicine

## 2022-12-11 DIAGNOSIS — K7469 Other cirrhosis of liver: Secondary | ICD-10-CM | POA: Diagnosis not present

## 2023-01-08 ENCOUNTER — Telehealth: Payer: Self-pay | Admitting: Pulmonary Disease

## 2023-01-08 NOTE — Telephone Encounter (Signed)
Patient would like to schedule CT scan. Patient would like to be scheduled at [facility]. Patient phone number is [phone].

## 2023-01-09 ENCOUNTER — Other Ambulatory Visit: Payer: Self-pay

## 2023-01-09 DIAGNOSIS — Z720 Tobacco use: Secondary | ICD-10-CM

## 2023-01-09 NOTE — Telephone Encounter (Signed)
Order has been placed for lung screen program per note from last year

## 2023-02-02 ENCOUNTER — Telehealth: Payer: Self-pay | Admitting: *Deleted

## 2023-02-02 NOTE — Telephone Encounter (Signed)
Please see last signed encounter. PT calling again today saying no one has gotten back to her with her req for a LCS CT appt  at this specific location. Last notes read:  Patient would like to schedule CT scan. Patient would like to be scheduled at [facility]. Patient phone number is [phone].   Please call PT to advise. TY.

## 2023-02-05 ENCOUNTER — Other Ambulatory Visit: Payer: Self-pay | Admitting: Emergency Medicine

## 2023-02-05 DIAGNOSIS — Z87891 Personal history of nicotine dependence: Secondary | ICD-10-CM

## 2023-02-05 DIAGNOSIS — F17210 Nicotine dependence, cigarettes, uncomplicated: Secondary | ICD-10-CM

## 2023-02-05 DIAGNOSIS — Z122 Encounter for screening for malignant neoplasm of respiratory organs: Secondary | ICD-10-CM

## 2023-02-05 NOTE — Telephone Encounter (Signed)
Called and spoke to patient. Patient has had previous LDCT but could not find where patient had a SMDV. Patient has been screened and scheduled for [facility] and LDCT. Patient is also aware she will be ineligible for the LCS program after this year due to her age. Medicare/Medicare have a cap of 77yo for screening. Patient verbalized understanding.

## 2023-03-20 ENCOUNTER — Encounter: Payer: Self-pay | Admitting: Acute Care

## 2023-03-20 ENCOUNTER — Ambulatory Visit (INDEPENDENT_AMBULATORY_CARE_PROVIDER_SITE_OTHER): Payer: Medicare Other | Admitting: Acute Care

## 2023-03-20 DIAGNOSIS — F17210 Nicotine dependence, cigarettes, uncomplicated: Secondary | ICD-10-CM

## 2023-03-20 NOTE — Progress Notes (Signed)
Virtual Visit via Telephone Note  I connected with Melinda Torres on 03/20/23 at  2:00 PM EDT by telephone and verified that I am speaking with the correct person using two identifiers.  Location: Patient:  At home Provider:  [address] [place], [place], [address]    I discussed the limitations, risks, security and privacy concerns of performing an evaluation and management service by telephone and the availability of in person appointments. I also discussed with the patient that there may be a patient responsible charge related to this service. The patient expressed understanding and agreed to proceed.    Shared Decision Making Visit Lung Cancer Screening Program [phone])   Eligibility: Age 77 y.o. Pack Years Smoking History Calculation 73.75  (# packs/per year x # years smoked) Recent History of coughing up blood  no Unexplained weight loss? no ( >Than 15 pounds within the last 6 months ) Prior History Lung / other cancer no (Diagnosis within the last 5 years already requiring surveillance chest CT Scans). Smoking Status Current Smoker Former Smokers: Years since quit:  NA  Quit Date:  NA  Visit Components: Discussion included one or more decision making aids. yes Discussion included risk/benefits of screening. yes Discussion included potential follow up diagnostic testing for abnormal scans. yes Discussion included meaning and risk of over diagnosis. yes Discussion included meaning and risk of False Positives. yes Discussion included meaning of total radiation exposure. yes  Counseling Included: Importance of adherence to annual lung cancer LDCT screening. yes Impact of comorbidities on ability to participate in the program. yes Ability and willingness to under diagnostic treatment. yes  Smoking Cessation Counseling: Current Smokers:  Discussed importance of smoking cessation. yes Information about tobacco cessation classes and interventions provided  to patient. yes Patient provided with "ticket" for LDCT Scan. yes Symptomatic Patient. no  Counseling NA Diagnosis Code: Tobacco Use Z72.0 Asymptomatic Patient yes  Counseling (Intermediate counseling: > three minutes counseling) Z3086 Former Smokers:  Discussed the importance of maintaining cigarette abstinence. yes Diagnosis Code: Personal History of Nicotine Dependence. V78.469 Information about tobacco cessation classes and interventions provided to patient. Yes Patient provided with "ticket" for LDCT Scan. yes Written Order for Lung Cancer Screening with LDCT placed in Epic. Yes (CT Chest Lung Cancer Screening Low Dose W/O CM) [id number] Z12.2-Screening of respiratory organs Z87.891-Personal history of nicotine dependence  I have spent 25 minutes of face to face/ virtual visit   time with  Melinda Torres discussing the risks and benefits of lung cancer screening. We viewed / discussed a power point together that explained in detail the above noted topics. We paused at intervals to allow for questions to be asked and answered to ensure understanding.We discussed that the single most powerful action that she can take to decrease her risk of developing lung cancer is to quit smoking. We discussed whether or not she is ready to commit to setting a quit date. We discussed options for tools to aid in quitting smoking including nicotine replacement therapy, non-nicotine medications, support groups, Quit Smart classes, and behavior modification. We discussed that often times setting smaller, more achievable goals, such as eliminating 1 cigarette a day for a week and then 2 cigarettes a day for a week can be helpful in slowly decreasing the number of cigarettes smoked. This allows for a sense of accomplishment as well as providing a clinical benefit. I provided  her  with smoking cessation  information  with contact information for community resources, classes, free  nicotine replacement therapy, and access to  mobile apps, text messaging, and on-line smoking cessation help. I have also provided her with   the office contact information in the event she needs to contact me, or the screening staff. We discussed the time and location of the scan, and that either Abigail Miyamoto RN, Karlton Lemon, RN  or I will call / send a letter with the results within 24-72 hours of receiving them. The patient verbalized understanding of all of  the above and had no further questions upon leaving the office. They have my contact information in the event they have any further questions.  I spent 3-4 minutes counseling on smoking cessation and the health risks of continued tobacco abuse.  I explained to the patient that there has been a high incidence of coronary artery disease noted on these exams. I explained that this is a non-gated exam therefore degree or severity cannot be determined. This patient is  not on statin therapy. I have asked the patient to follow-up with their PCP regarding any incidental finding of coronary artery disease and management with diet or medication as their PCP  feels is clinically indicated. The patient verbalized understanding of the above and had no further questions upon completion of the visit.      Bevelyn Ngo, NP 03/20/2023

## 2023-03-20 NOTE — Patient Instructions (Signed)

## 2023-03-21 ENCOUNTER — Ambulatory Visit (HOSPITAL_BASED_OUTPATIENT_CLINIC_OR_DEPARTMENT_OTHER)
Admission: RE | Admit: 2023-03-21 | Discharge: 2023-03-21 | Disposition: A | Discharge status: Home / Self Care | Payer: Medicare Other | Source: Ambulatory Visit | Attending: Acute Care | Admitting: Acute Care

## 2023-03-21 DIAGNOSIS — F17210 Nicotine dependence, cigarettes, uncomplicated: Secondary | ICD-10-CM | POA: Diagnosis present

## 2023-03-21 DIAGNOSIS — Z87891 Personal history of nicotine dependence: Secondary | ICD-10-CM | POA: Insufficient documentation

## 2023-03-21 DIAGNOSIS — Z122 Encounter for screening for malignant neoplasm of respiratory organs: Secondary | ICD-10-CM | POA: Diagnosis present

## 2023-09-04 ENCOUNTER — Other Ambulatory Visit: Payer: Self-pay | Admitting: Family Medicine

## 2023-09-04 DIAGNOSIS — Z1231 Encounter for screening mammogram for malignant neoplasm of breast: Secondary | ICD-10-CM

## 2023-10-16 ENCOUNTER — Ambulatory Visit
Admission: RE | Admit: 2023-10-16 | Discharge: 2023-10-16 | Disposition: A | Discharge status: Home / Self Care | Payer: Medicare Other | Source: Ambulatory Visit | Attending: Family Medicine

## 2023-10-16 DIAGNOSIS — Z1231 Encounter for screening mammogram for malignant neoplasm of breast: Secondary | ICD-10-CM

## 2024-01-23 ENCOUNTER — Other Ambulatory Visit (HOSPITAL_BASED_OUTPATIENT_CLINIC_OR_DEPARTMENT_OTHER): Payer: Self-pay | Admitting: Family Medicine

## 2024-01-23 DIAGNOSIS — K7460 Unspecified cirrhosis of liver: Secondary | ICD-10-CM

## 2024-02-13 ENCOUNTER — Ambulatory Visit (HOSPITAL_BASED_OUTPATIENT_CLINIC_OR_DEPARTMENT_OTHER)
Admission: RE | Admit: 2024-02-13 | Discharge: 2024-02-13 | Disposition: A | Discharge status: Home / Self Care | Source: Ambulatory Visit | Attending: Family Medicine | Admitting: Family Medicine

## 2024-02-13 DIAGNOSIS — K7460 Unspecified cirrhosis of liver: Secondary | ICD-10-CM | POA: Insufficient documentation

## 2024-03-02 ENCOUNTER — Encounter (HOSPITAL_BASED_OUTPATIENT_CLINIC_OR_DEPARTMENT_OTHER): Payer: Self-pay | Admitting: Family Medicine

## 2024-03-06 ENCOUNTER — Other Ambulatory Visit (HOSPITAL_BASED_OUTPATIENT_CLINIC_OR_DEPARTMENT_OTHER): Payer: Self-pay | Admitting: Family Medicine

## 2024-03-06 DIAGNOSIS — M79605 Pain in left leg: Secondary | ICD-10-CM

## 2024-03-06 NOTE — Telephone Encounter (Signed)
 Cleatus called from Vascular Ultrasound at [facility]. Just for clarity amanda works for heart care at [employer].  Better to reach vascular by calling [phone] direct line for [facility].  Wanting to provide information for future request.   Seems confusion and want to verify orders. Leaving office at 3:23 overturn at 7a.m.  Erica leave message or call back .

## 2024-03-06 NOTE — Telephone Encounter (Signed)
 Spoke with Alan. She wanted to make sure the ABI was still needed

## 2024-03-06 NOTE — Telephone Encounter (Signed)
 Melinda Torres from [facility] calling, stated there are two orders and needs clarification on which one is needed.   Melinda Torres [phone]

## 2024-03-07 NOTE — Telephone Encounter (Signed)
 Cleatus with [facility] calling in requesting to speak back with [facility] for clarity.   Transferred to clinic

## 2024-04-03 ENCOUNTER — Ambulatory Visit (INDEPENDENT_AMBULATORY_CARE_PROVIDER_SITE_OTHER)

## 2024-04-03 DIAGNOSIS — M79605 Pain in left leg: Secondary | ICD-10-CM

## 2024-04-03 LAB — VAS US ABI WITH/WO TBI
Left ABI: 0.99
Right ABI: 0.99

## 2024-06-04 IMAGING — CT CT CHEST SUPER D W/O CM
3 of 5 series · 16 of 36 positions shown, 18 images · non-contrast
Comparison: 11/15/2021

CLINICAL DATA: Follow-up suspicious right upper lobe lung nodule

EXAM:
CT CHEST WITHOUT CONTRAST
TECHNIQUE: Multidetector CT imaging of the chest was performed using thin slice
collimation for electromagnetic bronchoscopy planning purposes,
without intravenous contrast.
RADIATION DOSE REDUCTION: This exam was performed according to the
departmental dose-optimization program which includes automated
exposure control, adjustment of the mA and/or kV according to
patient size and/or use of iterative reconstruction technique.

[Series 2: chest without · axial · non-contrast · 0.69mm/px · z∈[-282,-66]mm · 5 of 162 slices shown, 7 images]
[im 27/162  mediastinal]
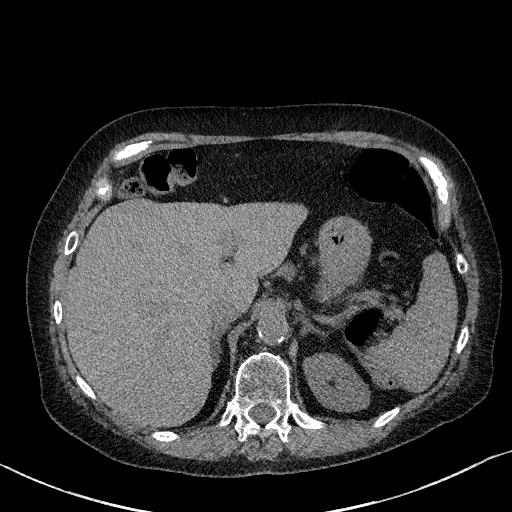
[im 27/162  lung]
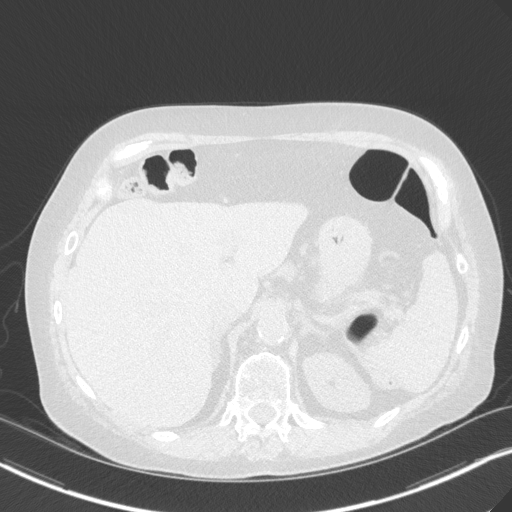
[im 54/162  lung]
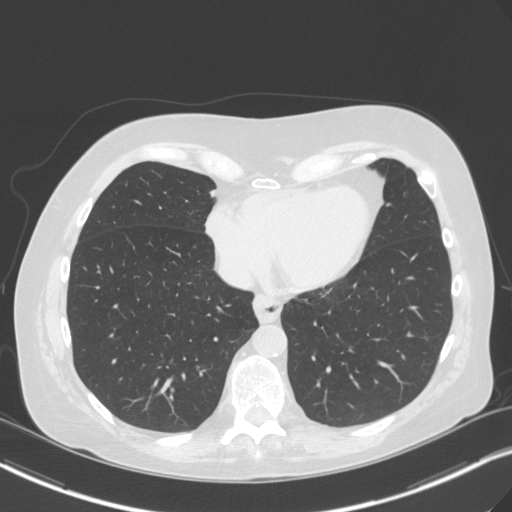
[im 81/162  lung]
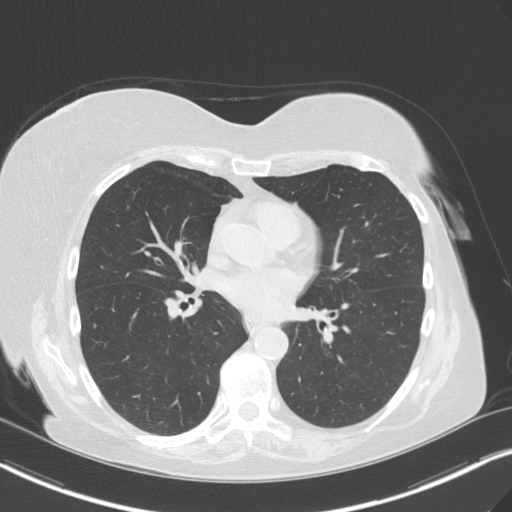
[im 108/162  lung]
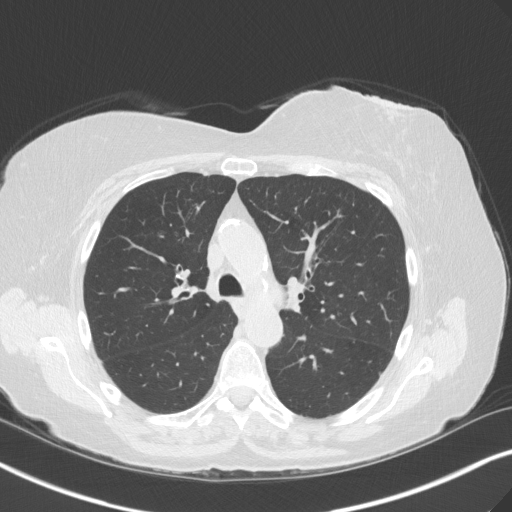
[im 135/162  mediastinal]
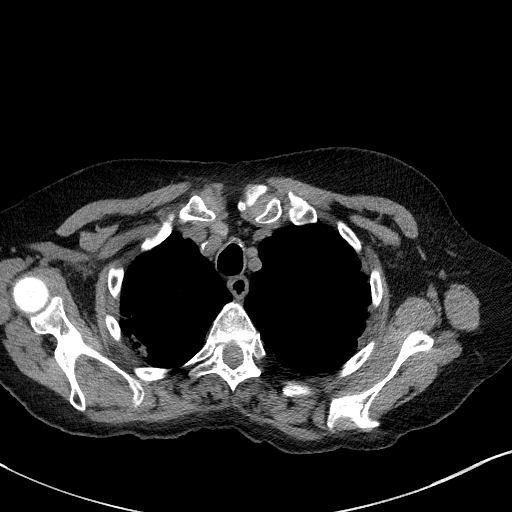
[im 135/162  lung]
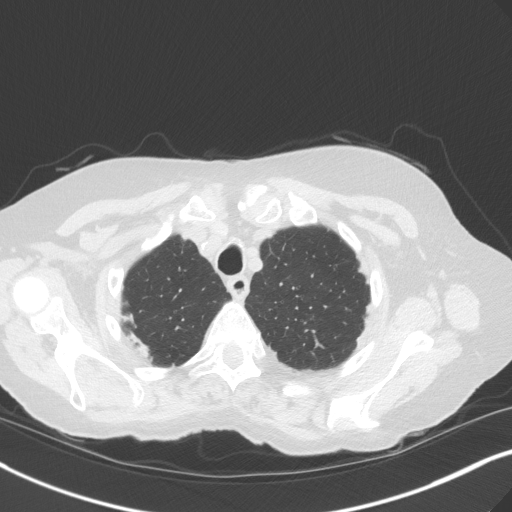

[Series 5: coronal · coronal · 0.66mm/px · 3 of 140 slices shown]
[im 28/140  lung]
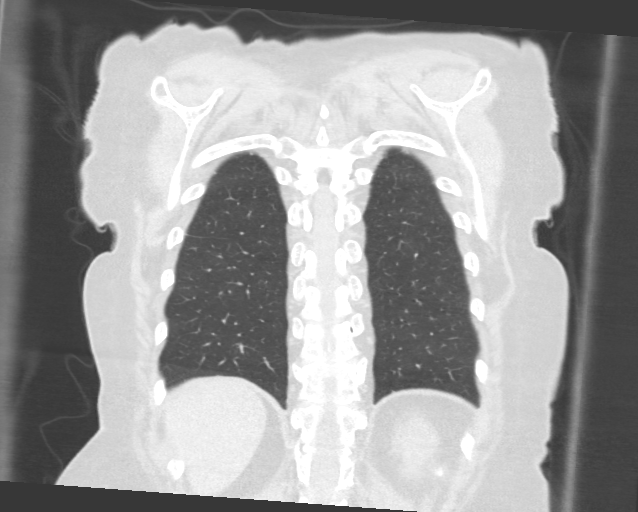
[im 56/140  lung]
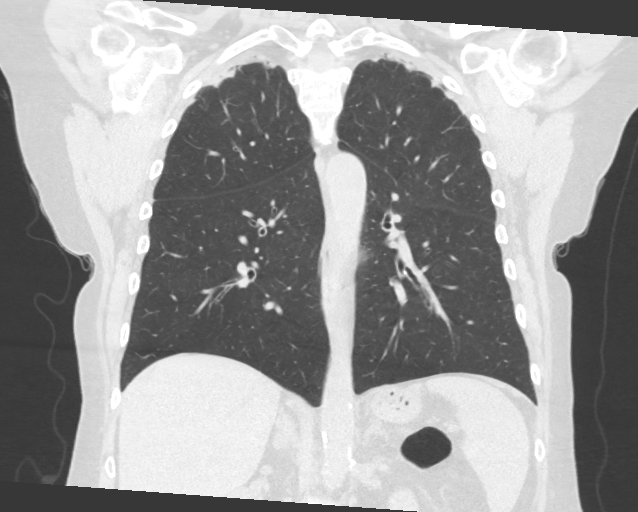
[im 84/140  lung]
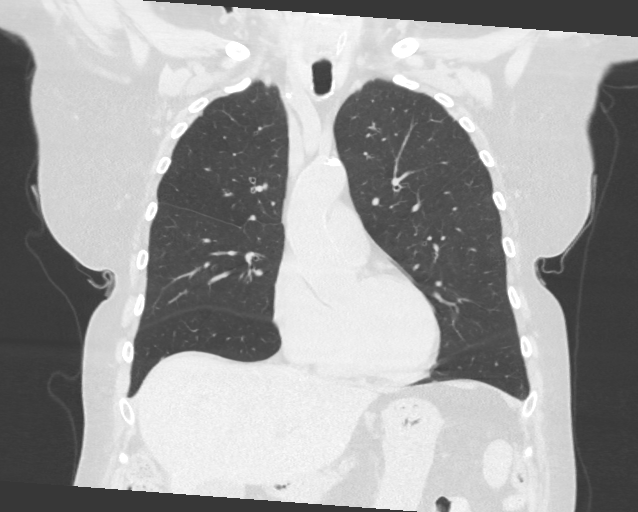

[Series 7: super d · axial · 0.69mm/px · z∈[-272,-32]mm · 8 of 351 slices shown]
[im 26/351  lung]
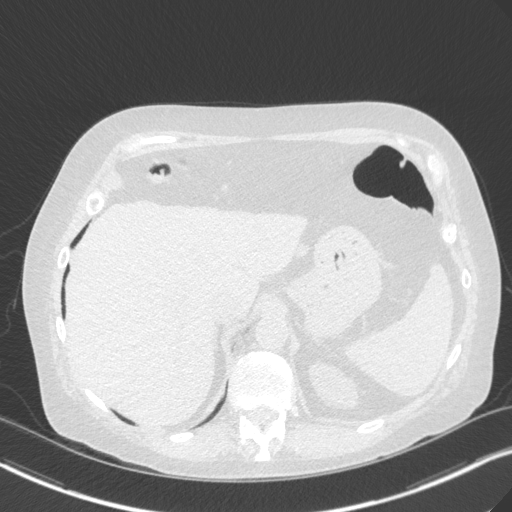
[im 76/351  lung]
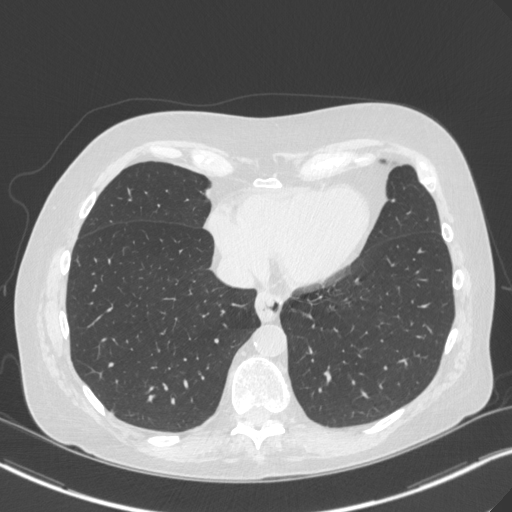
[im 126/351  lung]
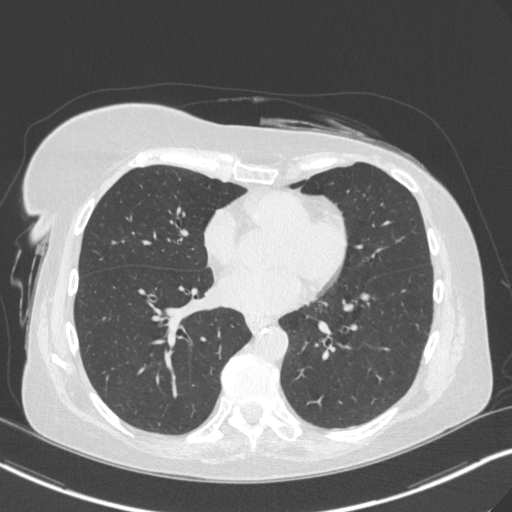
[im 151/351  lung]
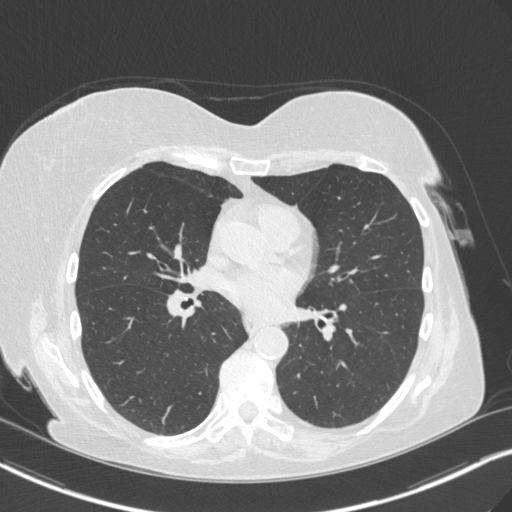
[im 201/351  lung]
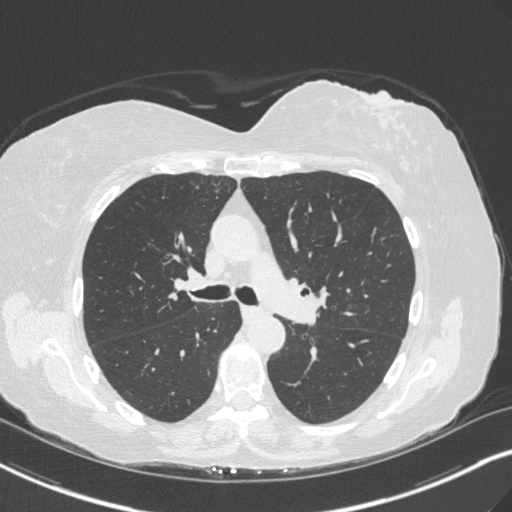
[im 226/351  lung]
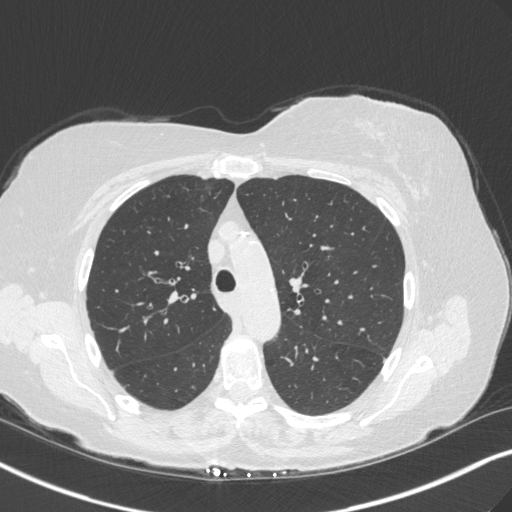
[im 276/351  lung]
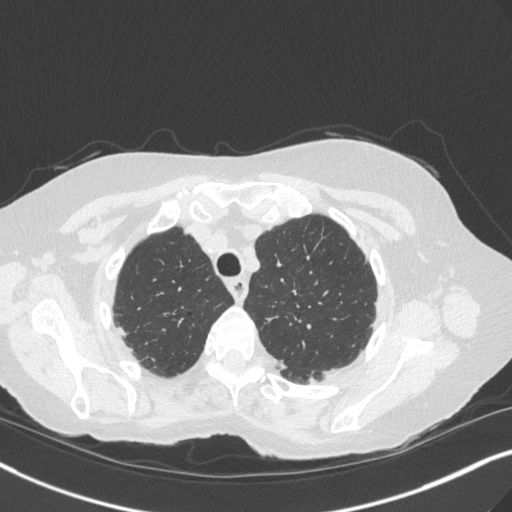
[im 326/351  lung]
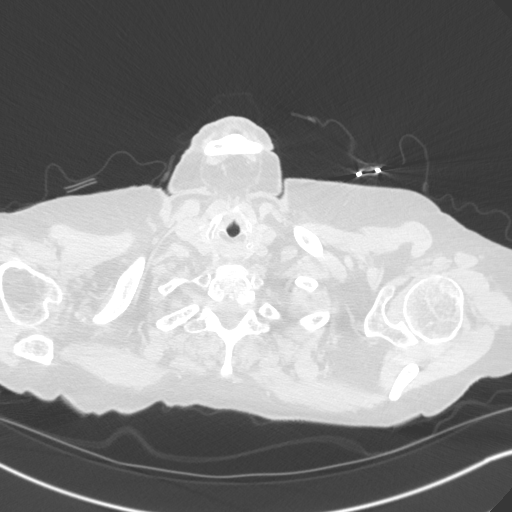

[16 of 36 positions shown; findings below may reference images not displayed]

FINDINGS: Cardiovascular: Aortic atherosclerosis. Normal heart size. No
pericardial effusion.

Mediastinum/Nodes: No enlarged mediastinal, hilar, or axillary lymph
nodes. Thyroid gland, trachea, and esophagus demonstrate no
significant findings.

Lungs/Pleura: Biapical pleuroparenchymal scarring. Minimal
centrilobular emphysema. Diffuse bilateral bronchial wall
thickening. A previously seen new nodular opacity in the anterior
medial right upper lobe is almost completely resolved, with only a
faint ground-glass residual (series 4, image 63). No pleural
effusion or pneumothorax.

Upper Abdomen: No acute abnormality. Coarse, nodular contour of the
liver in the included upper abdomen.

Musculoskeletal: No chest wall abnormality. No suspicious osseous
lesions identified. Unchanged wedge deformity of the L1 vertebral
body (series 6, image 108).
IMPRESSION: 1. A previously seen new nodular opacity in the anterior medial
right upper lobe is almost completely resolved. Findings are
consistent with resolution of nonspecific infection or inflammation.
2. Emphysema and diffuse bilateral bronchial wall thickening.
3. Coarse, nodular contour of the liver in the included upper
abdomen, suggesting cirrhosis.

Aortic Atherosclerosis (O93KJ-7C8.8) and Emphysema ([id number].5).
